# Patient Record
Sex: Male | Born: 2000 | Race: White | Hispanic: No | Marital: Single | Smoking: Never smoker
Health system: Southern US, Community
[De-identification: ages and names within clinical notes are randomized; demographics above are authoritative.]

---
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
## 2019-01-10 IMAGING — DX DG WRIST COMPLETE 3+V*L*
4 series · 4 of 4 positions shown · non-contrast
Comparison: None.

CLINICAL DATA: 59-year-old male with left wrist pain and injury,
initial encounter

EXAM:
LEFT WRIST - COMPLETE 3+ VIEW

[wrist pa]
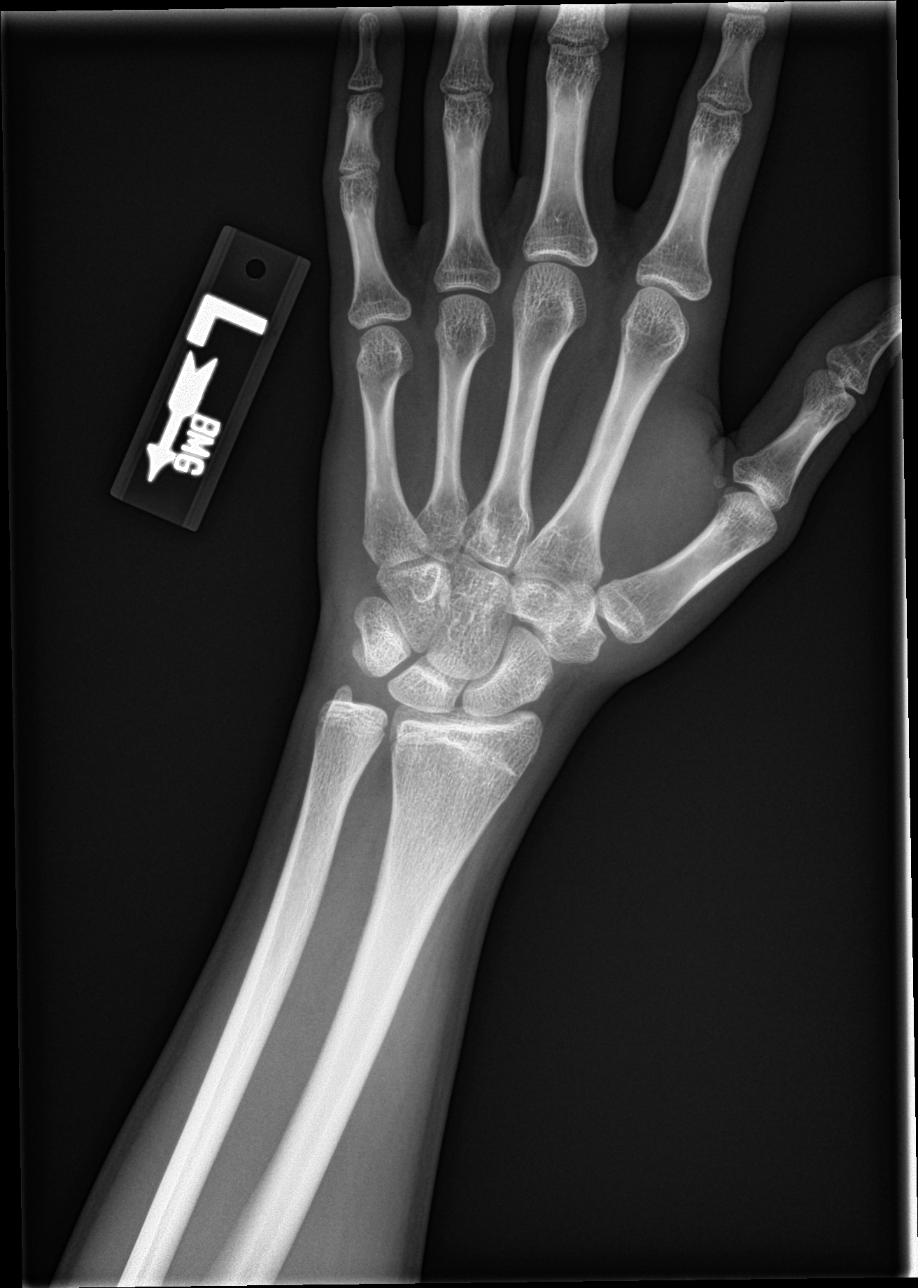

[wrist obl]
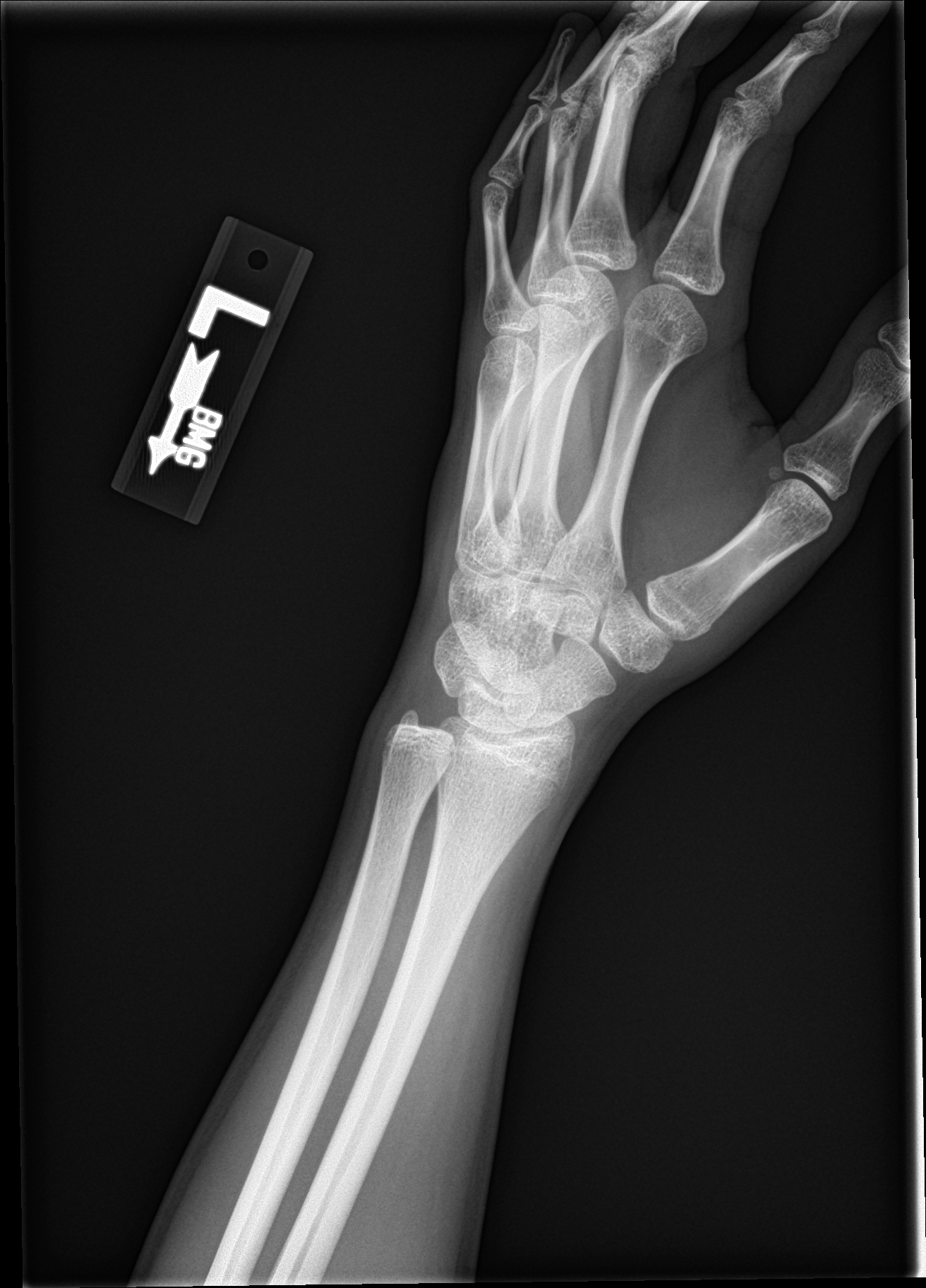

[wrist lat]
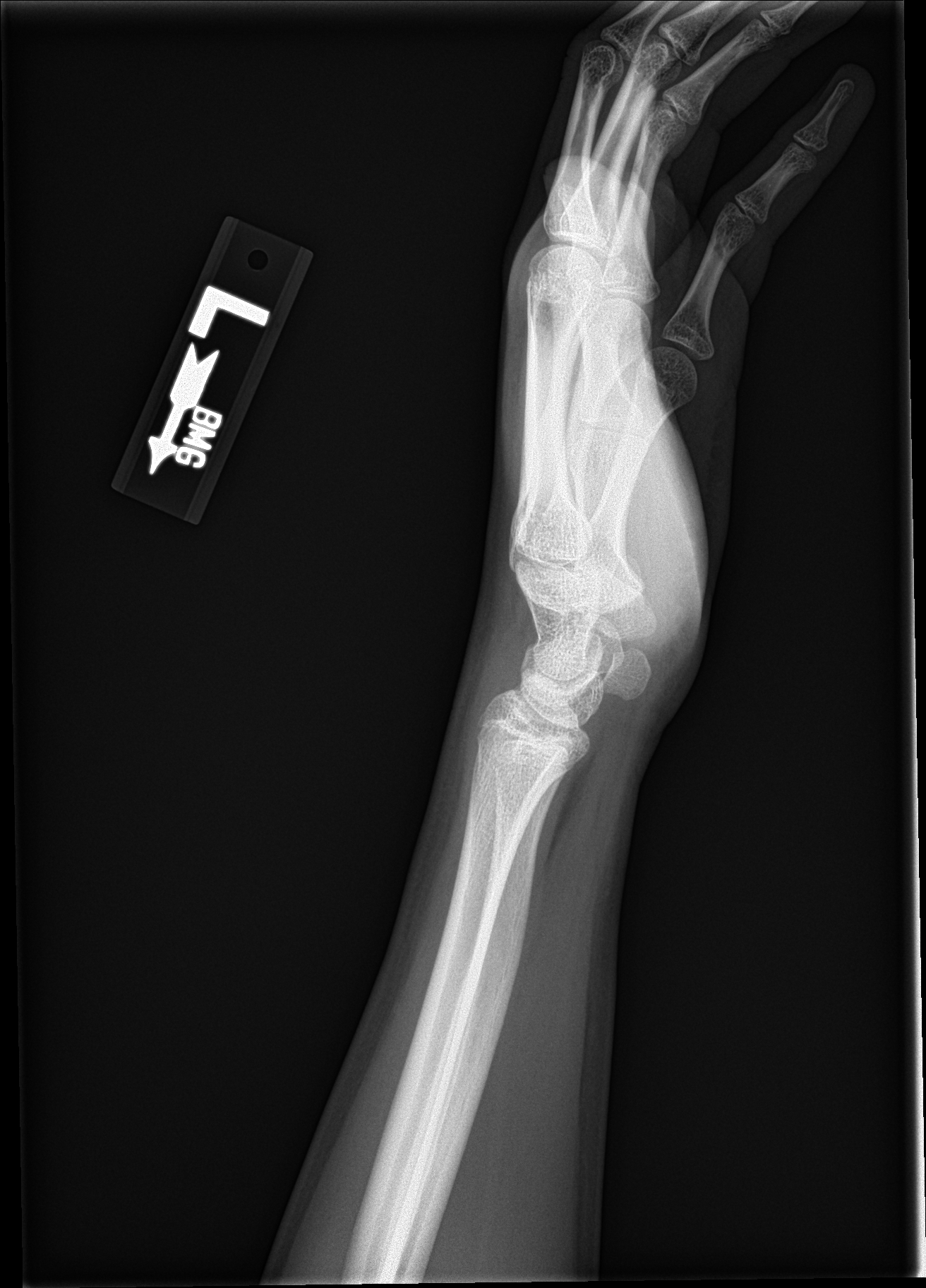

[wrist navicular]
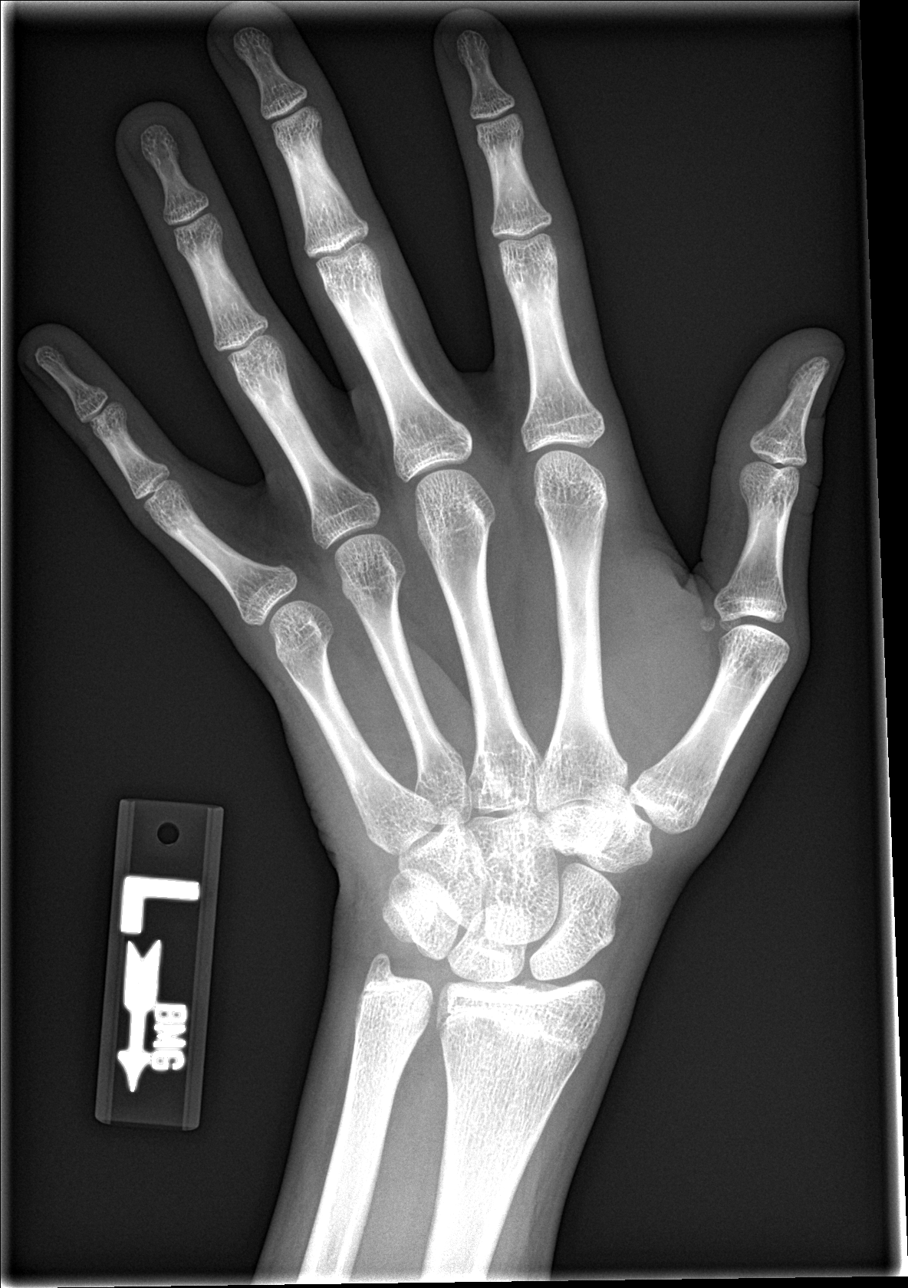

[4 of 4 positions shown; findings below may reference images not displayed]

FINDINGS: There is no evidence of fracture or dislocation. There is no
evidence of arthropathy or other focal bone abnormality. Soft
tissues are unremarkable.
IMPRESSION: Negative.
